# Patient Record
Sex: Female | Born: 1965 | Race: White | Hispanic: No | Marital: Married | Smoking: Never smoker
Health system: Southern US, Community
[De-identification: ages and names within clinical notes are randomized; demographics above are authoritative.]

## PROBLEM LIST (undated history)

## (undated) DIAGNOSIS — G809 Cerebral palsy, unspecified: Secondary | ICD-10-CM

## (undated) DIAGNOSIS — H269 Unspecified cataract: Secondary | ICD-10-CM

## (undated) DIAGNOSIS — S343XXA Injury of cauda equina, initial encounter: Secondary | ICD-10-CM

## (undated) DIAGNOSIS — I10 Essential (primary) hypertension: Secondary | ICD-10-CM

## (undated) DIAGNOSIS — G709 Myoneural disorder, unspecified: Secondary | ICD-10-CM

## (undated) HISTORY — DX: Cerebral palsy, unspecified: G80.9

## (undated) HISTORY — PX: COSMETIC SURGERY: SHX468

## (undated) HISTORY — DX: Myoneural disorder, unspecified: G70.9

## (undated) HISTORY — PX: EYE SURGERY: SHX253

## (undated) HISTORY — DX: Unspecified cataract: H26.9

## (undated) HISTORY — DX: Injury of cauda equina, initial encounter: S34.3XXA

## (undated) HISTORY — DX: Essential (primary) hypertension: I10

## (undated) HISTORY — PX: SPINE SURGERY: SHX786

---

## 2015-02-13 ENCOUNTER — Telehealth: Payer: Self-pay | Admitting: Family Medicine

## 2015-02-13 ENCOUNTER — Ambulatory Visit (INDEPENDENT_AMBULATORY_CARE_PROVIDER_SITE_OTHER): Payer: BLUE CROSS/BLUE SHIELD | Admitting: Family Medicine

## 2015-02-13 VITALS — BP 152/80 | HR 85 | Temp 98.4°F | Resp 18 | Wt 213.6 lb

## 2015-02-13 DIAGNOSIS — M6248 Contracture of muscle, other site: Secondary | ICD-10-CM | POA: Diagnosis not present

## 2015-02-13 DIAGNOSIS — M5412 Radiculopathy, cervical region: Secondary | ICD-10-CM

## 2015-02-13 DIAGNOSIS — Z9889 Other specified postprocedural states: Secondary | ICD-10-CM

## 2015-02-13 DIAGNOSIS — G709 Myoneural disorder, unspecified: Secondary | ICD-10-CM | POA: Insufficient documentation

## 2015-02-13 DIAGNOSIS — M62838 Other muscle spasm: Secondary | ICD-10-CM

## 2015-02-13 MED ORDER — PREDNISONE 10 MG PO TABS
ORAL_TABLET | ORAL | Status: DC
Start: 2015-02-13 — End: 2015-02-13

## 2015-02-13 MED ORDER — PREDNISONE 10 MG PO TABS: ORAL_TABLET | ORAL | Status: DC

## 2015-02-13 MED ORDER — CYCLOBENZAPRINE HCL 5 MG PO TABS: 5.0000 mg | ORAL_TABLET | Freq: Three times a day (TID) | ORAL | Status: DC | PRN

## 2015-02-13 NOTE — Progress Notes (Addendum)
Subjective:  This chart was scribed for Patricia SorensonEva Shaquala Broeker, MD by Stann Oresung-Kai Tsai, Medical Scribe. This patient was seen in Room 12 and the patient's care was started 4:51 PM.   Patient ID: Patricia MoritaLinda Braun, female    DOB: 07/13/1965, 49 y.o.   MRN: [id number] Chief Complaint  Patient presents with  . Back Pain    left arm pain    HPI Patricia Braun is a 49 y.o. female who presents to [facility] complaining of gradual onset left back pain radiating down to her left arm that started 3 days ago. She thought it was muscle spasm. She's tried applying heat to the areas and taking aleve without relief. She notes the pain is located in the middle of her back on the left side. She's noticed tingling pins and needles pain radiating down to the ulnar aspect of her left wrist, and down to the 5th digit. She's lost sleep because she would wake up every 2 hours due to pain. She denies rash or any skin changes, and neck pain. She denies kidney problems.   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 She informs that she woke up with this pain and had to go to ED at that time.   [place] was previously in [place]. She moved here in the past year for work (though works from home) and has not yet established w/ any physician.    Past Medical History  Diagnosis Date  . Cataract   . Hypertension   . Neuromuscular disorder (HCC)   . Cauda equina spinal cord injury (HCC)   . Cerebral palsy (HCC)    Prior to Admission medications   Medication Sig Start Date End Date Taking? Authorizing Provider  Multiple Vitamin (MULTIVITAMIN) capsule Take 1 capsule by mouth daily.   Yes Historical Provider, MD  naproxen sodium (ANAPROX) 220 MG tablet Take 220 mg by mouth 2 (two) times daily with a meal.   Yes Historical Provider, MD   Allergies  Allergen Reactions  . Fruit & Vegetable Daily [Nutritional Supplements]   . Vanilla Butternut Flavor     Review of Systems  Constitutional: Positive for activity change. Negative for fever,  chills, diaphoresis and fatigue.  Respiratory: Negative for chest tightness.   Gastrointestinal: Negative for nausea, vomiting, diarrhea and constipation.  Musculoskeletal: Positive for myalgias (left arm), back pain, arthralgias (left wrist), gait problem and neck stiffness (slight). Negative for joint swelling and neck pain.  Skin: Negative for rash and wound.  Allergic/Immunologic: Negative for immunocompromised state.  Neurological: Positive for numbness. Negative for weakness.       Objective:   Physical Exam  Constitutional: She is oriented to person, place, and time. She appears well-developed and well-nourished. No distress.  HENT:  Head: Normocephalic and atraumatic.  Eyes: EOM are normal. Pupils are equal, round, and reactive to light.  Neck: Neck supple.  Cardiovascular: Normal rate.   Pulmonary/Chest: Effort normal. No respiratory distress.  Musculoskeletal: Normal range of motion.  point tenderness around T-8, very severe. Likely scoliosis with severe tenderness to palpation in left lower rhomboid. Severe pain across upper thoracic and lower cervical spine process, and whole left trapezius tender to palpation Hyperesthetic over left trapezius and left wrist  Neurological: She is alert and oriented to person, place, and time.  Reflex Scores:      Tricep reflexes are 3+ on the right side and 2+ on the left side.      Bicep reflexes are 3+ on the right  side and 2+ on the left side.      Brachioradialis reflexes are 3+ on the right side and 2+ on the left side. Grasp normal despite pain  Skin: Skin is warm and dry. No rash noted.  Psychiatric: She has a normal mood and affect. Her behavior is normal.  Nursing note and vitals reviewed.   BP 152/80 mmHg  Pulse 85  Temp(Src) 98.4 F (36.9 C) (Oral)  Resp 18  Wt 213 lb 9.6 oz (96.888 kg)  SpO2 98%     Assessment & Plan:   1. Trapezius muscle spasm - take  qhs and 5 mg during day as tolerated by sedation. Heat.    2. Cervical radiculopathy - sxs of Lt C8 radiculopathy, no relief with aleve, try pred taper - f/u if no relief or recurs  3. History of cervical spinal surgery - pt reports that due to her CP she is "prone to spinal cord injuries" and had an emergent surgery on C6-7 20 yrs prior. No records avail but release signed.  Would be cautious about sxs and asked pt to follow up asap for any continue or progressive sxs as low thresh hold for imaging.    Meds ordered this encounter  Medications  . DISCONTD: naproxen sodium (ANAPROX) 220 MG tablet    Sig: Take 220 mg by mouth 2 (two) times daily with a meal.  . Multiple Vitamin (MULTIVITAMIN) capsule    Sig: Take 1 capsule by mouth daily.  Marland Kitchen DISCONTD: cyclobenzaprine (FLEXERIL) 5 MG tablet    Sig: Take 1-2 tablets (5-10 mg total) by mouth 3 (three) times daily as needed for muscle spasms.    Dispense:  30 tablet    Refill:  1  . DISCONTD: predniSONE (DELTASONE) 10 MG tablet    Sig: 6-5-4-3-2-1 tabs po qd    Dispense:  21 tablet    Refill:  0  . cyclobenzaprine (FLEXERIL) 5 MG tablet    Sig: Take 1-2 tablets (5-10 mg total) by mouth 3 (three) times daily as needed for muscle spasms.    Dispense:  30 tablet    Refill:  1  . predniSONE (DELTASONE) 10 MG tablet    Sig: 6-5-4-3-2-1 tabs po qd    Dispense:  21 tablet    Refill:  0    I personally performed the services described in this documentation, which was scribed in my presence. The recorded information has been reviewed and considered, and addended by me as needed.  Patricia Sorenson, MD MPH  By signing my name below, I, Stann Ore, attest that this documentation has been prepared under the direction and in the presence of Patricia Sorenson, MD. Electronically Signed: Stann Ore, Scribe. 02/13/2015 , 4:51 PM .

## 2015-02-13 NOTE — Patient Instructions (Signed)
I think you have a muscle spasm in your left trapezius.  I think you could benefit from some gentle physical therapy (massage, myofascial release) if this persists.  Apply heat to your upper left back 3-4 times a day for 2 minutes. If this worsens, return to clinic immediately.  If the finger numbness continues or returns, come back immediately as we would want to get a neck xray and poss a MRI to see if the spinal nerve is pinched. While you are on the prednisone, do not use with any other otc pain medication other than tylenol/acetaminophen - so no aleve, ibuprofen, motrin, advil, etc. If you are having side effects on the prednisone, stop it and switch back to naprosyn.  Cervical Radiculopathy Cervical radiculopathy happens when a nerve in the neck (cervical nerve) is pinched or bruised. This condition can develop because of an injury or as part of the normal aging process. Pressure on the cervical nerves can cause pain or numbness that runs from the neck all the way down into the arm and fingers. Usually, this condition gets better with rest. Treatment may be needed if the condition does not improve.  CAUSES This condition may be caused by:  Injury.  Slipped (herniated) disk.  Muscle tightness in the neck because of overuse.  Arthritis.  Breakdown or degeneration in the bones and joints of the spine (spondylosis) due to aging.  Bone spurs that may develop near the cervical nerves. SYMPTOMS Symptoms of this condition include:  Pain that runs from the neck to the arm and hand. The pain can be severe or irritating. It may be worse when the neck is moved.  Numbness or weakness in the affected arm and hand. DIAGNOSIS This condition may be diagnosed based on symptoms, medical history, and a physical exam. You may also have tests, including:  X-rays.  CT scan.  MRI.  Electromyogram (EMG).  Nerve conduction tests. TREATMENT In many cases, treatment is not needed for this condition.  With rest, the condition usually gets better over time. If treatment is needed, options may include:  Wearing a soft neck collar for short periods of time.  Physical therapy to strengthen your neck muscles.  Medicines, such as NSAIDs, oral corticosteroids, or spinal injections.  Surgery. This may be needed if other treatments do not help. Various types of surgery may be done depending on the cause of your problems. HOME CARE INSTRUCTIONS Managing Pain  Take over-the-counter and prescription medicines only as told by your health care provider.  If directed, apply ice to the affected area.  Put ice in a plastic bag.  Place a towel between your skin and the bag.  Leave the ice on for 20 minutes, 2-3 times per day.  If ice does not help, you can try using heat. Take a warm shower or warm bath, or use a heat pack as told by your health care provider.  Try a gentle neck and shoulder massage to help relieve symptoms. Activity  Rest as needed. Follow instructions from your health care provider about any restrictions on activities.  Do stretching and strengthening exercises as told by your health care provider or physical therapist. General Instructions  If you were given a soft collar, wear it as told by your health care provider.  Use a flat pillow when you sleep.  Keep all follow-up visits as told by your health care provider. This is important. SEEK MEDICAL CARE IF:  Your condition does not improve with treatment. SEEK IMMEDIATE  MEDICAL CARE IF:  Your pain gets much worse and cannot be controlled with medicines.  You have weakness or numbness in your hand, arm, face, or leg.  You have a high fever.  You have a stiff, rigid neck.  You lose control of your bowels or your bladder (have incontinence).  You have trouble with walking, balance, or speaking.   This information is not intended to replace advice given to you by your health care provider. Make sure you discuss  any questions you have with your health care provider.   Document Released: 11/21/2000 Document Revised: 11/17/2014 Document Reviewed: 04/22/2014 Elsevier Interactive Patient Education [employer]

## 2015-02-13 NOTE — Telephone Encounter (Signed)
Outside call.  Pt at pharmacy. meds not there. In Epic - rx sent, but pharmacy does not have record. Verified/reordered prednisone and flexeril as listed by Rx from Dr. Clelia CroftShaw today. Patient advised.

## 2015-02-15 ENCOUNTER — Telehealth: Payer: Self-pay

## 2015-02-15 ENCOUNTER — Ambulatory Visit (INDEPENDENT_AMBULATORY_CARE_PROVIDER_SITE_OTHER): Payer: BLUE CROSS/BLUE SHIELD | Admitting: Family Medicine

## 2015-02-15 ENCOUNTER — Ambulatory Visit (INDEPENDENT_AMBULATORY_CARE_PROVIDER_SITE_OTHER): Payer: BLUE CROSS/BLUE SHIELD

## 2015-02-15 VITALS — BP 126/88 | HR 76 | Temp 99.1°F | Resp 16 | Wt 213.5 lb

## 2015-02-15 DIAGNOSIS — M21372 Foot drop, left foot: Secondary | ICD-10-CM

## 2015-02-15 DIAGNOSIS — M25512 Pain in left shoulder: Secondary | ICD-10-CM

## 2015-02-15 DIAGNOSIS — G809 Cerebral palsy, unspecified: Secondary | ICD-10-CM | POA: Diagnosis not present

## 2015-02-15 DIAGNOSIS — M25532 Pain in left wrist: Secondary | ICD-10-CM | POA: Diagnosis not present

## 2015-02-15 DIAGNOSIS — Z9889 Other specified postprocedural states: Secondary | ICD-10-CM | POA: Diagnosis not present

## 2015-02-15 DIAGNOSIS — M5412 Radiculopathy, cervical region: Secondary | ICD-10-CM | POA: Diagnosis not present

## 2015-02-15 DIAGNOSIS — R269 Unspecified abnormalities of gait and mobility: Secondary | ICD-10-CM

## 2015-02-15 DIAGNOSIS — Z79899 Other long term (current) drug therapy: Secondary | ICD-10-CM

## 2015-02-15 LAB — COMPREHENSIVE METABOLIC PANEL
ALBUMIN: 4.7 g/dL (ref 3.6–5.1)
ALT: 20 U/L (ref 6–29)
AST: 22 U/L (ref 10–35)
Alkaline Phosphatase: 73 U/L (ref 33–130)
BILIRUBIN TOTAL: 0.5 mg/dL (ref 0.2–1.2)
BUN: 18 mg/dL (ref 7–25)
CO2: 27 mmol/L (ref 20–31)
CREATININE: 0.69 mg/dL (ref 0.50–1.05)
Calcium: 9.6 mg/dL (ref 8.6–10.4)
Chloride: 101 mmol/L (ref 98–110)
Glucose, Bld: 120 mg/dL — ABNORMAL HIGH (ref 65–99)
Potassium: 4.4 mmol/L (ref 3.5–5.3)
Sodium: 138 mmol/L (ref 135–146)
TOTAL PROTEIN: 7.6 g/dL (ref 6.1–8.1)

## 2015-02-15 LAB — C-REACTIVE PROTEIN: CRP: <0.5 mg/dL (ref <0.60)

## 2015-02-15 MED ORDER — KETOROLAC TROMETHAMINE 60 MG/2ML IM SOLN
60.0000 mg | Freq: Once | INTRAMUSCULAR | Status: AC
  Administered 2015-02-15: 60 mg via INTRAMUSCULAR

## 2015-02-15 MED ORDER — DIAZEPAM 5 MG PO TABS: 5.0000 mg | ORAL_TABLET | Freq: Four times a day (QID) | ORAL | Status: DC | PRN

## 2015-02-15 MED ORDER — HYDROCODONE-ACETAMINOPHEN 5-325 MG PO TABS: 1.0000 | ORAL_TABLET | Freq: Four times a day (QID) | ORAL | Status: AC | PRN

## 2015-02-15 NOTE — Telephone Encounter (Signed)
The answering machine called and stated pt called after hours to let Dr Clelia CroftShaw know she is in a lot of pain and would like a callback at [phone]

## 2015-02-15 NOTE — Progress Notes (Addendum)
Subjective:    Patient ID: Patricia Braun, female    DOB: 19-Dec-1965, 49 y.o.   MRN: [id number] By signing my name below, I, Patricia Braun, attest that this documentation has been prepared under the direction and in the presence of [name], MD.  Electronically Signed: Littie Braun, Medical Scribe. 02/15/2015. 1:04 PM.  Chief Complaint  Patient presents with  . Follow-up    from last visit, says the soreness and numbness if worst    HPI HPI Comments: Patricia Braun is a 49 y.o. female who presents to the [facility] for a follow-up. History of cerebral palsy. She was told that due to her premature NCP status, she would be prone to spinal cord injuries and had emergent surgery on C5-C7 20 years ago when she suddenly woke up with inability to move her legs. She was seen 2 days ago with severe pain in her left back radiating to her left arm with pain causing numbness and tingling in her 5th digit and ulnar aspect of wrist. She was treated with a prednisone 60 mg taper and flexeril, but pain has severely worsened.   Patient did take the flexeril before bed after she was seen here 2 days ago. She did use a brace which helped with her left wrist and hand pain, but she still had difficulty sleeping due to pain, only getting about 5 hours of sleep that night. She did have some urinary frequency that night only. She started the prednisone the following morning. The pain seemed to be improving throughout that day, but then it started to worsen again. Patient still has constant numbness and tingling to her left 5th finger and hand. She notes having intermittent pain even if she keeps her hand still. She feels as if the pain originates at the wrist and is not a shooting pain. She has pain whenever she tries to pick up anything. Patient is also still having pain from her left back/neck radiating to her left upper arm. She notes that the pain in her thumb is improving. She has also been having  associated lightheadedness and difficulty concentrating. She does not have any pain to her elbow. She last took 2 Aleve at 3:00 AM this morning. Patient denies history of kidney problems. She notes that she did have some numbness to her 5th finger before, but this was minor and she attributes this to sleeping wrong.  Patient also states she has had 7 months of worsening foot drop. She is much more dependent on her walker. She used to be able to walk leaning on walls or using furniture.  Past Medical History  Diagnosis Date  . Cataract   . Hypertension   . Neuromuscular disorder (HCC)   . Cauda equina spinal cord injury (HCC)   . Cerebral palsy [facility])    Past Surgical History  Procedure Laterality Date  . Cosmetic surgery    . Eye surgery    . Spine surgery     Current Outpatient Prescriptions on File Prior to Visit  Medication Sig Dispense Refill  . Multiple Vitamin (MULTIVITAMIN) capsule Take 1 capsule by mouth daily.     No current facility-administered medications on file prior to visit.   Allergies  Allergen Reactions  . Fruit & Vegetable Daily [Nutritional Supplements]   . Vanilla Butternut Flavor    History reviewed. No pertinent family history. Social History   Social History  . Marital Status: Married    Spouse Name: N/A  .  Number of Children: N/A  . Years of Education: N/A   Social History Main Topics  . Smoking status: Never Smoker   . Smokeless tobacco: None  . Alcohol Use: None  . Drug Use: None  . Sexual Activity: Not Asked   Other Topics Concern  . None   Social History Narrative   Depression screen [facility] 2/9 02/13/2015  Decreased Interest 0  Down, Depressed, Hopeless 0  PHQ - 2 Score 0     Review of Systems  Constitutional: Positive for activity change and fatigue. Negative for fever, chills and appetite change.  Cardiovascular: Negative for leg swelling.  Gastrointestinal: Negative for nausea, vomiting, abdominal pain and diarrhea.    Genitourinary: Positive for frequency. Negative for dysuria, flank pain and difficulty urinating.  Musculoskeletal: Positive for myalgias, back pain, arthralgias, gait problem and neck pain. Negative for joint swelling.  Skin: Negative for color change and rash.  Allergic/Immunologic: Negative for immunocompromised state.  Neurological: Positive for weakness, light-headedness and numbness. Negative for dizziness and facial asymmetry.  Psychiatric/Behavioral: Positive for sleep disturbance and decreased concentration. Negative for behavioral problems, confusion, self-injury and dysphoric mood. The patient is not nervous/anxious.        Objective:  BP 126/88 mmHg  Pulse 76  Temp(Src) 99.1 F (37.3 C) (Oral)  Resp 16  Wt 213 lb 8 oz (96.843 kg)  SpO2 96%  Physical Exam  Constitutional: She is oriented to person, place, and time. She appears well-developed and well-nourished. No distress.  HENT:  Head: Normocephalic and atraumatic.  Mouth/Throat: Oropharynx is clear and moist. No oropharyngeal exudate.  Eyes: Pupils are equal, round, and reactive to light.  Neck: Neck supple.  Cardiovascular: Normal rate.   Pulmonary/Chest: Effort normal.  Musculoskeletal: She exhibits no edema.  Normal ROM in left wrist and left elbow. Moderately decreased ROM in left shoulder and cervical spine. Patient has severe pain with motion and with light palpation, leaving us unable to do muscle testing due to pain severity.  Neurological: She is alert and oriented to person, place, and time.  Reflex Scores:      Tricep reflexes are 3+ on the right side and 3+ on the left side.      Bicep reflexes are 3+ on the right side and 3+ on the left side.      Brachioradialis reflexes are 3+ on the right side and 3+ on the left side. Strength in wrist flexion and wrist extension 5/5, though very limited ability to withstand additional muscle testing due to pain. 4/5 bilateral plantarflexion and dorsiflexion. 4+/5  hamstrings and quads on right, 4/5 hamstrings and quads on left.  Skin: Skin is warm and dry. No rash noted.  Psychiatric: She has a normal mood and affect. Her behavior is normal.  Nursing note and vitals reviewed.  [facility] (PRIMARY) x-ray report read by Dr. 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 Loss of normal lordosis and C5-C7 fusion. Hardware appears grossly in place. Left wrist - Moderate arthritic change. No acute abnormality.    Dg Cervical Spine Complete  02/15/2015  CLINICAL DATA:  Cervical radiculopathy. Radiculopathy is severely worsening. EXAM: CERVICAL SPINE - COMPLETE 4+ VIEW COMPARISON:  None. FINDINGS: They corpectomy is noted at C6. Fusion is present from C5-C7. Alignment is anatomic. Marked adjacent level endplate degenerative changes present at C4-5. Significant uncovertebral spurring is present bilaterally. This results and moderate to severe osseous foraminal narrowing bilaterally at C4-5. Osseous foraminal narrowing is also present bilaterally at C7-T1. There  slight retrolisthesis at C4-5. Vertebral body height are normal. Alignment is otherwise maintained. IMPRESSION: 1. Adjacent level disease with chronic endplate changes and moderate bilateral foraminal stenosis due to uncovertebral spurring at C4-5 and to a slightly lesser extent at C7-T1. Electronically Signed   By: Marin Roberts M.D.   On: 02/15/2015 14:42   Dg Wrist 2 Views Left  02/15/2015  CLINICAL DATA:  49 year old female with acute left wrist pain, no known injury. Initial encounter. EXAM: LEFT WRIST - 2 VIEW COMPARISON:  None. FINDINGS: Bone mineralization is within normal limits. Distal radius and ulna intact. Radiocarpal joint space and carpal joint spaces within normal limits. Carpal alignment within normal limits. Metacarpals and MCP joint spaces within normal limits. Phalanges intact. The tip of the third distal phalanx is not entirely included. IMPRESSION: No osseous abnormality  identified in the left hand. Electronically Signed   By: Odessa Fleming M.D.   On: 02/15/2015 14:41    Assessment & Plan:   1. 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 Pt reports that prior c-spine surgery was done emergently when over several weeks she developed worsening lower ext weakness and pain and she is now left with permanent parasthesias below the waist from cord injury from this.  Pt now with 5-7d of severely worsening left cervical radiculopathy  sxs causing severe hyperesthesias through left arm, failed conservative treatment with prednisone and flexeril.  Stop flexeril.  Cont prednisone  x 5d then wean down (additional pills sent to pharmacy). Called [facility] today but they won't even schedule pt to see a provider despite her severity and my request until we get her prior 49 yo surgical records from [place] which is very frustrating as there is nothing else I can offer pt with her worsening sxs since she is maxed out on medical therapy of high dose prednisone along with valium for muscle relaxer/sleep and vicodin to dull pain. May need to add on gabapentin but I am worried about new polypharmacy and sedation as pt is naive to most of these meds.  CMA spoke with pt's prior physician who will fax her old records stat but unlikely to be able to track down surgical records due to age.  Due to multiple areas of concern on spin of C4-5, c7-T1, and lumbar spine have already proceeded with stat neurosurg referral and c-spine MRI.  2. History of cervical spinal surgery   3. Wrist pain, acute, left   4. Polypharmacy   5. Cerebral palsy, unspecified type (HCC)   6. Abnormality of gait   7. Left foot drop - pt with worsening 7 mos of left lower ext weakness causing more gait instability and debility - will need to start on imaging of lumbar spine as well - sxs more difficult to follow  since pt has chronic lower ext paraesthesia and gait instability from prior cord injury and CP.  Needs l-spine xray and likely MRI referral for this as well though I am really hoping pt can get in to ortho or neurosurg for this since clearly her problems are far beyond my scope.  8. Shoulder pain, acute, left     Orders Placed This Encounter  Procedures  . DG Cervical Spine Complete    Standing Status: Future     Number of Occurrences: 1     Standing Expiration Date: 02/15/2016    Order  Specific Question:  Reason for Exam (SYMPTOM  OR DIAGNOSIS REQUIRED)    Answer:  severely worsening cervical radiculopathy    Order Specific Question:  Is the patient pregnant?    Answer:  No    Order Specific Question:  Preferred imaging location?    Answer:  External  . DG Wrist 2 Views Left    Standing Status: Future     Number of Occurrences: 1     Standing Expiration Date: 02/15/2016    Order Specific Question:  Reason for Exam (SYMPTOM  OR DIAGNOSIS REQUIRED)    Answer:  severe pain    Order Specific Question:  Is the patient pregnant?    Answer:  No    Order Specific Question:  Preferred imaging location?    Answer:  External  . MR Cervical Spine W Contrast    Standing Status: Future     Number of Occurrences:      Standing Expiration Date: 04/17/2016    Order Specific Question:  If indicated for the ordered procedure, I authorize the administration of contrast media per Radiology protocol    Answer:  Yes    Order Specific Question:  Reason for Exam (SYMPTOM  OR DIAGNOSIS REQUIRED)    Answer:  worsening Left cervical radiculopathy concern for C4-5 impingement; h/o emergenct c-spine surg - prone to injury due to CP per pt    Order Specific Question:  Preferred imaging location?    Answer:  [facility]    Order Specific Question:  Does the patient have a pacemaker or implanted devices?    Answer:  No    Order Specific Question:  What is the patient's sedation requirement?    Answer:   Anti-anxiety  . Sedimentation Rate  . C-reactive protein  . Comprehensive metabolic panel  . Ambulatory referral to Orthopedic Surgery    Referral Priority:  Urgent    Referral Type:  Surgical    Referral Reason:  Specialty Services Required    Requested Specialty:  Orthopedic Surgery    Number of Visits Requested:  1  . Ambulatory referral to Neurosurgery    Referral Priority:  Urgent    Referral Type:  Surgical    Referral Reason:  Specialty Services Required    Requested Specialty:  Neurosurgery    Number of Visits Requested:  1    Meds ordered this encounter  Medications  . ketorolac (TORADOL) injection 60 mg    Sig:   . diazepam (VALIUM) 5 MG tablet    Sig: Take 1 tablet (5 mg total) by mouth every 6 (six) hours as needed for muscle spasms or sedation.    Dispense:  30 tablet    Refill:  0  . HYDROcodone-acetaminophen (NORCO/VICODIN) 5-325 MG tablet    Sig: Take 1 tablet by mouth every 6 (six) hours as needed for moderate pain.    Dispense:  30 tablet    Refill:  0    I personally performed the services described in this documentation, which was scribed in my presence. The recorded information has been reviewed and considered, and addended by me as needed.  [name], MD MPH

## 2015-02-15 NOTE — Patient Instructions (Signed)
If the pain is getting worse or more weakness or numbness, then please go to ER or come back immediately. In the meantime, I have placed stat urgent referral for the MRI, orthopedics, and neurosurgery.  Continue on the prednisone 60 for Wed and Thursday.  Make sure we touch base on Thursday to ensure you have needed referrals set up and decide how to continue/taper you off of the prednisone. Cervical Radiculopathy Cervical radiculopathy happens when a nerve in the neck (cervical nerve) is pinched or bruised. This condition can develop because of an injury or as part of the normal aging process. Pressure on the cervical nerves can cause pain or numbness that runs from the neck all the way down into the arm and fingers. Usually, this condition gets better with rest. Treatment may be needed if the condition does not improve.  CAUSES This condition may be caused by:  Injury.  Slipped (herniated) disk.  Muscle tightness in the neck because of overuse.  Arthritis.  Breakdown or degeneration in the bones and joints of the spine (spondylosis) due to aging.  Bone spurs that may develop near the cervical nerves. SYMPTOMS Symptoms of this condition include:  Pain that runs from the neck to the arm and hand. The pain can be severe or irritating. It may be worse when the neck is moved.  Numbness or weakness in the affected arm and hand. DIAGNOSIS This condition may be diagnosed based on symptoms, medical history, and a physical exam. You may also have tests, including:  X-rays.  CT scan.  MRI.  Electromyogram (EMG).  Nerve conduction tests. TREATMENT In many cases, treatment is not needed for this condition. With rest, the condition usually gets better over time. If treatment is needed, options may include:  Wearing a soft neck collar for short periods of time.  Physical therapy to strengthen your neck muscles.  Medicines, such as NSAIDs, oral corticosteroids, or spinal  injections.  Surgery. This may be needed if other treatments do not help. Various types of surgery may be done depending on the cause of your problems. HOME CARE INSTRUCTIONS Managing Pain  Take over-the-counter and prescription medicines only as told by your health care provider.  If directed, apply ice to the affected area.  Put ice in a plastic bag.  Place a towel between your skin and the bag.  Leave the ice on for 20 minutes, 2-3 times per day.  If ice does not help, you can try using heat. Take a warm shower or warm bath, or use a heat pack as told by your health care provider.  Try a gentle neck and shoulder massage to help relieve symptoms. Activity  Rest as needed. Follow instructions from your health care provider about any restrictions on activities.  Do stretching and strengthening exercises as told by your health care provider or physical therapist. General Instructions  If you were given a soft collar, wear it as told by your health care provider.  Use a flat pillow when you sleep.  Keep all follow-up visits as told by your health care provider. This is important. SEEK MEDICAL CARE IF:  Your condition does not improve with treatment. SEEK IMMEDIATE MEDICAL CARE IF:  Your pain gets much worse and cannot be controlled with medicines.  You have weakness or numbness in your hand, arm, face, or leg.  You have a high fever.  You have a stiff, rigid neck.  You lose control of your bowels or your bladder (have incontinence).  You have trouble with walking, balance, or speaking.   This information is not intended to replace advice given to you by your health care provider. Make sure you discuss any questions you have with your health care provider.   Document Released: 11/21/2000 Document Revised: 11/17/2014 Document Reviewed: 04/22/2014 Elsevier Interactive Patient Education [employer].

## 2015-02-15 NOTE — Telephone Encounter (Signed)
Pt is needing to talk with dr Clelia Croftshaw, she is still having pain and numbness and has not been able to sleep much

## 2015-02-15 NOTE — Telephone Encounter (Signed)
She is here in the clinic seeing Dr. Clelia CroftShaw.

## 2015-02-15 NOTE — Telephone Encounter (Signed)
Pt is here in the office.  

## 2015-02-16 ENCOUNTER — Telehealth: Payer: Self-pay

## 2015-02-16 ENCOUNTER — Other Ambulatory Visit: Payer: Self-pay | Admitting: Family Medicine

## 2015-02-16 LAB — SEDIMENTATION RATE: SED RATE: 1 mm/h (ref 0–30)

## 2015-02-16 NOTE — Telephone Encounter (Signed)
The MRI of the cervical spine w contrast has been denied by [employer].  A peer to peer review is available for this case.  The phone number is [phone].  They will need the patient's name, DOB, and member ID number.  This appeal will close on 02/18/15 at 5pm.  The patient's member ID is [id number].

## 2015-02-16 NOTE — Telephone Encounter (Signed)
Pt would like to know if Dr Clelia CroftShaw would call her in another round of PREDNISONE 1 MG, she will finish her last round tomorrow. Please call [phone]     [facility] [address]

## 2015-02-16 NOTE — Telephone Encounter (Signed)
Spoke with Dr. Anselm JunglingHo. Peer-to-peer complete. Order ID# [id number]

## 2015-02-17 MED ORDER — PREDNISONE 20 MG PO TABS: ORAL_TABLET | ORAL | Status: AC

## 2015-02-17 NOTE — Telephone Encounter (Signed)
[facility]ainer called about this pt. They feel she should dee a neurologist instead. The pt agreed. Dr. Clelia CroftShaw please advise.

## 2015-02-17 NOTE — Telephone Encounter (Signed)
I assume they mean neurosurgery- that is fine but the fact is that this pt needs to be cared for by a specialist - she is having rapidly progressive worsening sxs on oral meds and neurosurg is impossible to get into - i already placed neurosurg referral at the time of her visit on Tues- please call [facility] and let them know that ortho is refusing pt and that she has a h/o atypical and rapidly progressive sxs - hopefully she can get an appt within next wk. . .  Please also let pt know that when she has the MRI she should ask for a copy of the disc at the time to bring with her to her next specialty appt.

## 2015-02-17 NOTE — Telephone Encounter (Signed)
Prednisone sent in to pharmacy.  Patricia Braun got authorization on c-spine MRI through peer-to-peer yesterday so please try to get scheduled EMERGENTLY. Remind pt if sxs getting worse, come back here or to the ER.

## 2015-02-18 NOTE — Telephone Encounter (Signed)
I received confirmation of the appointment for 02/21/15 at 1:30pm with Dr Gerlene FeeKritzer.  Regarding the MRI, I will contact [facility].  They are very prompt and able to work patients in.

## 2015-02-18 NOTE — Telephone Encounter (Signed)
AMAZING MELANIE!!  STRONG WORK!!!!!!  VERY IMPRESSIVE MELANIE - THANK YOU!

## 2015-02-18 NOTE — Telephone Encounter (Signed)
Patricia Braun - THANK YOU!

## 2015-02-18 NOTE — Telephone Encounter (Signed)
What is the update on the c-spine MRI? We got authorization - it is schedule for 12/21??????  This is a stat emergent MRI - needed to be done this week - prior to her neurosurg appt on Monday please.

## 2015-02-18 NOTE — Telephone Encounter (Signed)
I spoke to a scheduler at [facility], and the earliest appointment they had was Thursday 02/24/15.  [facility] has in-house MRIs, so they could do the scans on the day of her appointment if they need it for the consultation.

## 2015-02-18 NOTE — Telephone Encounter (Signed)
Spoke to the new patient coordinator at [facility], and she said they would try to get the patient in for an appointment on Monday, 02/21/15.  They will fast track the review and work the patient in.

## 2015-02-20 ENCOUNTER — Encounter: Payer: Self-pay | Admitting: Family Medicine

## 2015-02-20 NOTE — Telephone Encounter (Signed)
Perfect, thank you

## 2015-02-21 ENCOUNTER — Telehealth: Payer: Self-pay

## 2015-02-21 ENCOUNTER — Other Ambulatory Visit: Payer: Self-pay | Admitting: Family Medicine

## 2015-02-21 NOTE — Telephone Encounter (Signed)
Pt is needing to know if she can increase the amount of valium she takes at night to help her sleep

## 2015-02-21 NOTE — Telephone Encounter (Signed)
Dr. Clelia CroftShaw, pt sent a mychart message in more detail.

## 2015-02-22 ENCOUNTER — Telehealth: Payer: Self-pay

## 2015-02-22 NOTE — Telephone Encounter (Signed)
.  .  .        xx

## 2015-02-22 NOTE — Telephone Encounter (Signed)
Sent an email - valium was written from 5 mg every 6 hours as needed.  She can increase it to 10mg  twice a day but make sure to separate from the hydrocodone by 2 hours

## 2015-02-22 NOTE — Telephone Encounter (Signed)
Dr. Clelia CroftShaw  Upset patient she needs MRI not CAT SCAN,  Requesting a refund, needs to have insurance approve MRI.  She would like you to call her, ASAP.    [phone]

## 2015-02-23 NOTE — Telephone Encounter (Signed)
As anyone can see in her chart, we ordered an MRI - I never ordered or disucssed a CT scan - we couldn't get the pt's MRI until next week but neurosurgery worked pt in for an appointment in their office this past Monday (4d ago) and so they said that they would get whatever imaging she needed in their office.  Pt had xrays in our office and was given copies of the discs and she did need these before we could get insurance authorization for the MRI - which we did get.  Also, with her prior surgery it is possible that the neurosurgeon opted for a different imaging option. Please find out what is going on and how we can help.

## 2015-02-24 ENCOUNTER — Other Ambulatory Visit: Payer: Self-pay | Admitting: Family Medicine

## 2015-02-24 MED ORDER — DIAZEPAM 5 MG PO TABS: 5.0000 mg | ORAL_TABLET | Freq: Two times a day (BID) | ORAL | Status: AC | PRN

## 2015-02-24 NOTE — Telephone Encounter (Signed)
Pt already scheduled thru neurosurgery

## 2015-02-27 NOTE — Telephone Encounter (Signed)
Patricia Braun - this patient was very dissatisfied that we were not able to obtain her MRI prior to her neurosurgery appointment. We did try but [facility] could not get her in sooner and due to her radiculopathy sxs and medical history, neurosurgery did work her in quickly and informed us that they would be able to get any necessary imaging in their office after her appointment in a much more timely manner. I am concerned that it seems that perhaps she has not pursued the appropriate imaging through neurosurgery and therefore is risking permanent nerve injury with her delaying her care but not contacting their office to alert them of sxs and proceed with the recommended imaging.

## 2015-03-02 ENCOUNTER — Other Ambulatory Visit: Payer: Self-pay

## 2015-03-02 ENCOUNTER — Encounter: Payer: Self-pay | Admitting: Family Medicine

## 2015-03-04 ENCOUNTER — Encounter: Payer: Self-pay | Admitting: Family Medicine

## 2015-03-05 ENCOUNTER — Encounter: Payer: Self-pay | Admitting: Family Medicine

## 2015-03-05 ENCOUNTER — Other Ambulatory Visit: Payer: Self-pay | Admitting: Family Medicine

## 2015-03-09 ENCOUNTER — Telehealth: Payer: Self-pay

## 2015-03-09 NOTE — Telephone Encounter (Signed)
Patient is requesting a change of pharmacy to express script  -   diazepam (VALIUM) 5 MG tablet  Patient has enough pills to get her through, but wants this in place b/c of the time of delivery through the mail.    Dr Clelia CroftShaw   [phone]

## 2015-03-09 NOTE — Telephone Encounter (Signed)
Spoke with pt, I advised message from Dr. Clelia CroftShaw. She had a difficult time understanding our policy. I spent 20 minutes on the phone with pt explaining every way that I could. The protection of Dr. Clelia CroftShaw license with controlled substances, our contract she has to sign, the law in general. Dr. Clelia CroftShaw are going to be here Friday from 8-6pm? I just want to make sure because she agreed to come in on Friday. She also wants to see if you can write a letter to her employer about her neck and needing something (I cant remember exactly what she said) for her job to pay for it. I advised her that she could speak to you about it at the OV and it would be something we can possibly do. She also wanted me to send this so it can make her OV go a lot quicker and she is requesting 20 days in advance because she uses mail order. Thanks Dr. Clelia CroftShaw.

## 2015-03-09 NOTE — Telephone Encounter (Signed)
For a controlled medication, our [facility] policy requests a pt to establish with a single PCP at [facility] with whom a controlled sub contract is agreed and requests that pts come in for an office visit for refills.  I saw Ms. Patricia Braun for an acute issue about her back and rx'ed valium temporarily for acute muscle spasm so did not review the details about long term use of this med, nor get an appropriate history of her mental health and sleep issues.   While it is an acceptable therapeutic option for some people, it should only be started regularly by a provider that has a detailed understanding of pt's history and pt needs to understands potential risks/adverse outcomes so she can make an informed decision whether this is something she wants to continue. When that type of sedative is used on a daily basis it can have severe withdrawal symptoms including life-threatening seizures so causes a physical (not just mental) dependence and tolerance.    In summary, if pt is still interested on staying on valium indefinitely, she needs to come in for an appointment to establish care and discuss this.

## 2015-03-10 NOTE — Telephone Encounter (Signed)
fyi

## 2015-03-11 NOTE — Telephone Encounter (Signed)
Discussed with Bertram MillardPatty Pait of [facility] risk management (phone [phone]). It is recommended that patient establish with a primary care doctor elsewhere rather than [facility].  She can call [employer] [facility] at [phone] which is a complimentary referral service to individuals to help identify physicians in [facility] that can best serve their needs - they can help her find a good doctor for her.  She can also contact the local medical society.  She is welcome to continue to come to [facility] for her urgent care needs until she can transition into a clinic who can better provide the type of care she desires. When I was trying my hardest to fulfill her needs it was still a frustrating experience for her and I cannot do any better of a job in the future thereby leading to the likelihood that she will continue to be frustrated by whatever care I am able to provide.  Please let pt know. Thanks.  I am happy to speak directly to pt to tell her this as well if she has any questions.

## 2015-03-16 NOTE — Telephone Encounter (Signed)
I called pt at 1:50 pm the day the last note was written and informed her of that.

## 2015-11-19 ENCOUNTER — Encounter (HOSPITAL_COMMUNITY): Payer: Self-pay | Admitting: Oncology

## 2015-11-19 ENCOUNTER — Emergency Department (HOSPITAL_COMMUNITY)
Admission: EM | Admit: 2015-11-19 | Discharge: 2015-11-19 | Disposition: A | Discharge status: Home / Self Care | Payer: BLUE CROSS/BLUE SHIELD | Attending: Emergency Medicine | Admitting: Emergency Medicine

## 2015-11-19 DIAGNOSIS — R5381 Other malaise: Secondary | ICD-10-CM | POA: Insufficient documentation

## 2015-11-19 DIAGNOSIS — I10 Essential (primary) hypertension: Secondary | ICD-10-CM | POA: Diagnosis not present

## 2015-11-19 DIAGNOSIS — Z7952 Long term (current) use of systemic steroids: Secondary | ICD-10-CM | POA: Insufficient documentation

## 2015-11-19 DIAGNOSIS — Z791 Long term (current) use of non-steroidal anti-inflammatories (NSAID): Secondary | ICD-10-CM | POA: Insufficient documentation

## 2015-11-19 DIAGNOSIS — G809 Cerebral palsy, unspecified: Secondary | ICD-10-CM | POA: Diagnosis not present

## 2015-11-19 DIAGNOSIS — M62838 Other muscle spasm: Secondary | ICD-10-CM | POA: Diagnosis present

## 2015-11-19 LAB — CBC
HEMATOCRIT: 45.6 % (ref 36.0–46.0)
HEMOGLOBIN: 15 g/dL (ref 12.0–15.0)
MCH: 28.9 pg (ref 26.0–34.0)
MCHC: 32.9 g/dL (ref 30.0–36.0)
MCV: 87.9 fL (ref 78.0–100.0)
Platelets: 352 10*3/uL (ref 150–400)
RBC: 5.19 MIL/uL — AB (ref 3.87–5.11)
RDW: 13.4 % (ref 11.5–15.5)
WBC: 11.3 10*3/uL — ABNORMAL HIGH (ref 4.0–10.5)

## 2015-11-19 LAB — CBG MONITORING, ED: Glucose-Capillary: 104 mg/dL — ABNORMAL HIGH (ref 65–99)

## 2015-11-19 LAB — BASIC METABOLIC PANEL
ANION GAP: 6 (ref 5–15)
BUN: 20 mg/dL (ref 6–20)
CO2: 27 mmol/L (ref 22–32)
CREATININE: 1.13 mg/dL — AB (ref 0.44–1.00)
Calcium: 9.7 mg/dL (ref 8.9–10.3)
Chloride: 106 mmol/L (ref 101–111)
GFR calc non Af Amer: 55 mL/min — ABNORMAL LOW (ref >60)
GLUCOSE: 116 mg/dL — AB (ref 65–99)
Potassium: 4.3 mmol/L (ref 3.5–5.1)
Sodium: 139 mmol/L (ref 135–145)

## 2015-11-19 MED ORDER — IBUPROFEN 200 MG PO TABS
200.0000 mg | ORAL_TABLET | Freq: Once | ORAL | Status: AC
  Administered 2015-11-19: 200 mg via ORAL
  Filled 2015-11-19: qty 1

## 2015-11-19 NOTE — ED Triage Notes (Signed)
Per pt she was in [place] shopping today.  Approximately 1.5 hours ago pt stated she began to feel weak and friends told her she was pale.  States as she was leaving the restaurant she felt light headed. Pt denies any pain.  No Shob. Neurologically intact.  Speech is normal.  Grips equal b/l.  Pt is hypertensive in triage.  Pt is not pale at this time, face is flushed.

## 2015-11-19 NOTE — ED Provider Notes (Signed)
[facility]-EMERGENCY DEPT Provider Note   CSN: [id number] Arrival date & time: 11/19/15  1931     History   Chief Complaint Chief Complaint  Patient presents with  . Weakness    HPI Patricia Braun is a 50 y.o. female.  She is here for evaluation of an alternating period of feeling cold, then feeling hot. She also was noted to initially looked pale and looked flushed. according to her husband. In addition, she has had spasms of her arms, left greater than right, which commonly occur when she feels tired, and is secondary to her prior spinal cord injury. She denies fever, nausea, vomiting, cough, chest pain, focal weakness or paresthesia. She has a chronic gait disability secondary to cerebral palsy, and uses a walker. She and her husband were visiting in [place], [place], when the episode occurred, and they drove back here, afterwards, a 5 Hour drive. She states that she was bitten by an insect on her right volar wrist, yesterday and it caused a red bump. No other recent illnesses. There are no other known modifying factors.  HPI  Past Medical History:  Diagnosis Date  . Cataract   . Cauda equina spinal cord injury (HCC)   . Cerebral palsy (HCC)   . Hypertension   . Neuromuscular disorder [facility])     Patient Active Problem List   Diagnosis Date Noted  . Neuromuscular disorder [facility])     Past Surgical History:  Procedure Laterality Date  . COSMETIC SURGERY    . EYE SURGERY    . SPINE SURGERY      OB History    No data available       Home Medications    Prior to Admission medications   Medication Sig Start Date End Date Taking? Authorizing Provider  diazepam (VALIUM) 5 MG tablet Take 1-2 tablets (5-10 mg total) by mouth every 12 (twelve) hours as needed for muscle spasms or sedation. 02/24/15   Sherren Mocha, MD  HYDROcodone-acetaminophen (NORCO/VICODIN) 5-325 MG tablet Take 1 tablet by mouth every 6 (six) hours as needed for moderate pain. 02/15/15   Sherren Mocha, MD    Multiple Vitamin (MULTIVITAMIN) capsule Take 1 capsule by mouth daily.    Historical Provider, MD  predniSONE (DELTASONE) 20 MG tablet Take 3 tabs po qd x 3d, then 2 tabs po qd x 3d, then 1 tab po qd x 3d, then 1/2 tab po qd x 2d 02/17/15   Sherren Mocha, MD    Family History History reviewed. No pertinent family history.  Social History Social History  Substance Use Topics  . Smoking status: Never Smoker  . Smokeless tobacco: Never Used  . Alcohol use 0.0 oz/week     Comment: socially     Allergies   Fruit & vegetable daily [nutritional supplements] and Vanilla butternut flavor   Review of Systems Review of Systems  All other systems reviewed and are negative.    Physical Exam Updated Vital Signs BP 150/92 (BP Location: Left Arm)   Pulse 95   Temp 98.3 F (36.8 C) (Oral)   Resp 18   Ht 5\' 5"  (1.651 m)   Wt 190 lb (86.2 kg)   SpO2 98%   BMI 31.62 kg/m   Physical Exam  Constitutional: She is oriented to person, place, and time. She appears well-developed and well-nourished.  HENT:  Head: Normocephalic and atraumatic.  Eyes: Conjunctivae and EOM are normal. Pupils are equal, round, and reactive to light.  Neck: Normal  range of motion and phonation normal. Neck supple.  Cardiovascular: Normal rate and regular rhythm.   Pulmonary/Chest: Effort normal and breath sounds normal. She exhibits no tenderness.  Abdominal: Soft. She exhibits no distension. There is no tenderness. There is no guarding.  Musculoskeletal: Normal range of motion.  Leg atrophy, consistent with diagnosis of cerebral palsy. She walks with a halting gait. Patient and husband state that she is walking normally, at this time.  Neurological: She is alert and oriented to person, place, and time. She exhibits normal muscle tone.  Skin: Skin is warm and dry.  Small red papule right breast at site of "infected bite." No other rash.  Psychiatric: She has a normal mood and affect. Her behavior is normal.  Judgment and thought content normal.  Nursing note and vitals reviewed.    ED Treatments / Results  Labs (all labs ordered are listed, but only abnormal results are displayed) Labs Reviewed  BASIC METABOLIC PANEL - Abnormal; Notable for the following:       Result Value   Glucose, Bld 116 (*)    Creatinine, Ser 1.13 (*)    GFR calc non Af Amer 55 (*)    All other components within normal limits  CBC - Abnormal; Notable for the following:    WBC 11.3 (*)    RBC 5.19 (*)    All other components within normal limits  CBG MONITORING, ED - Abnormal; Notable for the following:    Glucose-Capillary 104 (*)    All other components within normal limits  URINALYSIS, ROUTINE W REFLEX MICROSCOPIC (NOT AT [facility])    EKG  EKG Interpretation  Date/Time:  Saturday November 19 2015 19:42:13 EDT Ventricular Rate:  85 PR Interval:    QRS Duration: 85 QT Interval:  349 QTC Calculation: 415 R Axis:   44 Text Interpretation:  Sinus rhythm No old tracing to compare Confirmed by [facility]  MD, Oluwaseun Cremer [phone]) on 11/19/2015 7:45:36 PM       Radiology No results found.  Procedures Procedures (including critical care time)  Medications Ordered in ED Medications  ibuprofen (ADVIL,MOTRIN) tablet 200 mg (200 mg Oral Given 11/19/15 2145)     Initial Impression / Assessment and Plan / ED Course  I have reviewed the triage vital signs and the nursing notes.  Pertinent labs & imaging results that were available during my care of the patient were reviewed by me and considered in my medical decision making (see chart for details).  Clinical Course    Medications  ibuprofen (ADVIL,MOTRIN) tablet 200 mg (200 mg Oral Given 11/19/15 2145)    Patient Vitals for the past 24 hrs:  BP Temp Temp src Pulse Resp SpO2 Height Weight  11/19/15 2139 150/92 98.3 F (36.8 C) Oral 95 18 98 % - -  11/19/15 1941 160/90 98.3 F (36.8 C) Oral 88 18 95 % 5\' 5"  (1.651 m) 190 lb (86.2 kg)    10:00 PM Reevaluation  with update and discussion. After initial assessment and treatment, an updated evaluation reveals No further complaints. She remains comfortable. Findings discussed with patient and husband, all questions answered. Greely Atiyeh L    Final Clinical Impressions(s) / ED Diagnoses   Final diagnoses:  Malaise    Nonspecific symptoms, in spinal cord injury patient. Evaluation reassuring. Doubt ACS, PE, serious bacterial infection or metabolic instability.  Nursing Notes Reviewed/ Care Coordinated Applicable Imaging Reviewed Interpretation of Laboratory Data incorporated into ED treatment  The patient appears reasonably screened and/or stabilized for discharge  and I doubt any other medical condition or other [facility] requiring further screening, evaluation, or treatment in the ED at this time prior to discharge.  Plan: Home Medications- continue; Home Treatments- rest, fluids; return here if the recommended treatment, does not improve the symptoms; Recommended follow up- PCP prn   New Prescriptions New Prescriptions   No medications on file     Mancel Bale, MD 11/19/15 2202

## 2015-11-19 NOTE — Discharge Instructions (Signed)
Your test results, today, were normal.  Try to get plenty of rest and drink a lot of fluids.  Your doctor if needed, for problems.

## 2015-11-19 NOTE — ED Notes (Signed)
No reaction to medication noted alert and oriented x 3 left with visitor wheelchair to car.

## 2015-11-19 NOTE — ED Notes (Signed)
Assisted pt. To the bathroom for urine. But pt. Missed the hat. Will try again later.

## 2017-04-15 IMAGING — CR DG CERVICAL SPINE COMPLETE 4+V
7 series · 7 of 7 positions shown · non-contrast
Comparison: None.

CLINICAL DATA: Cervical radiculopathy. Radiculopathy is severely
worsening.

EXAM:
CERVICAL SPINE - COMPLETE 4+ VIEW

[lateral (1 of 2)]
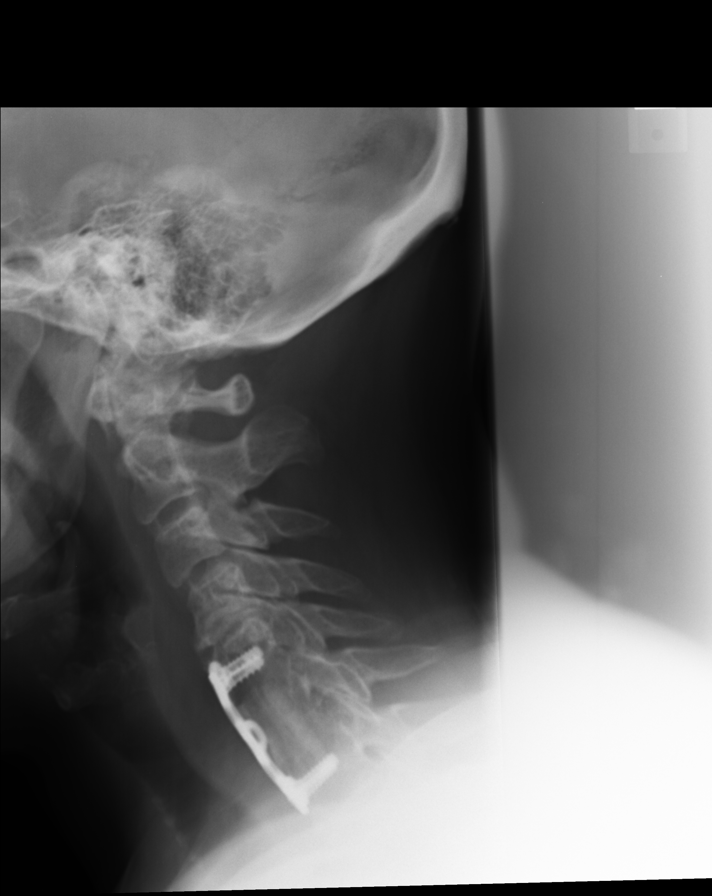

[ap open mouth]
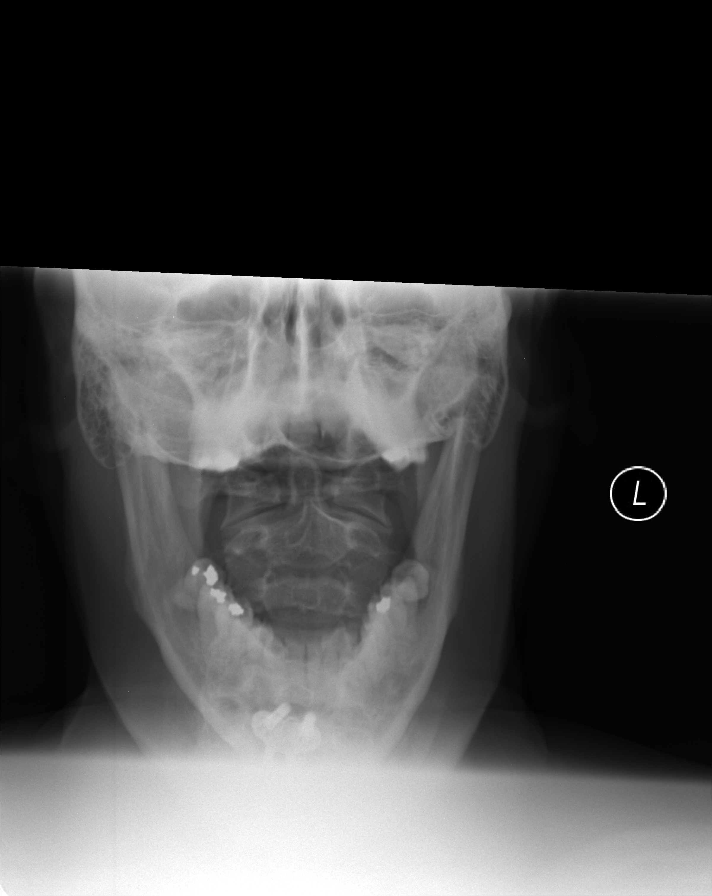

[AP]
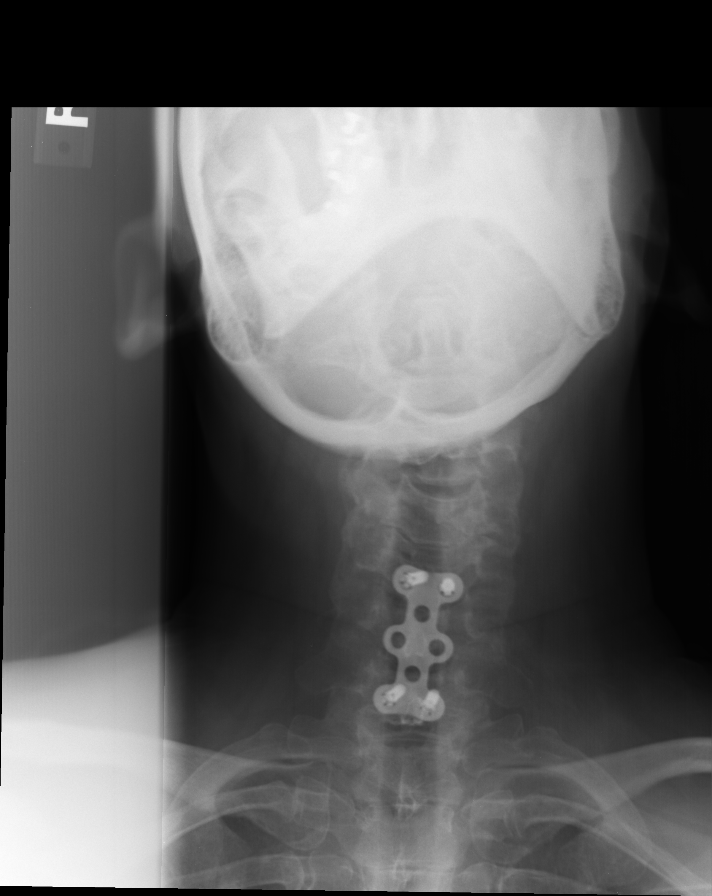

[swimmers]
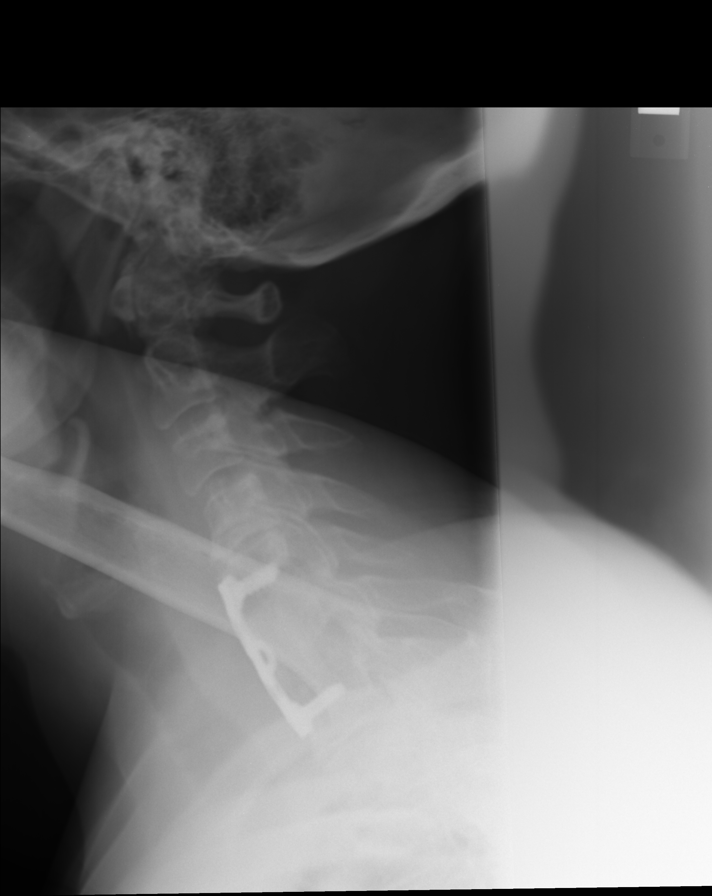

[lpo]
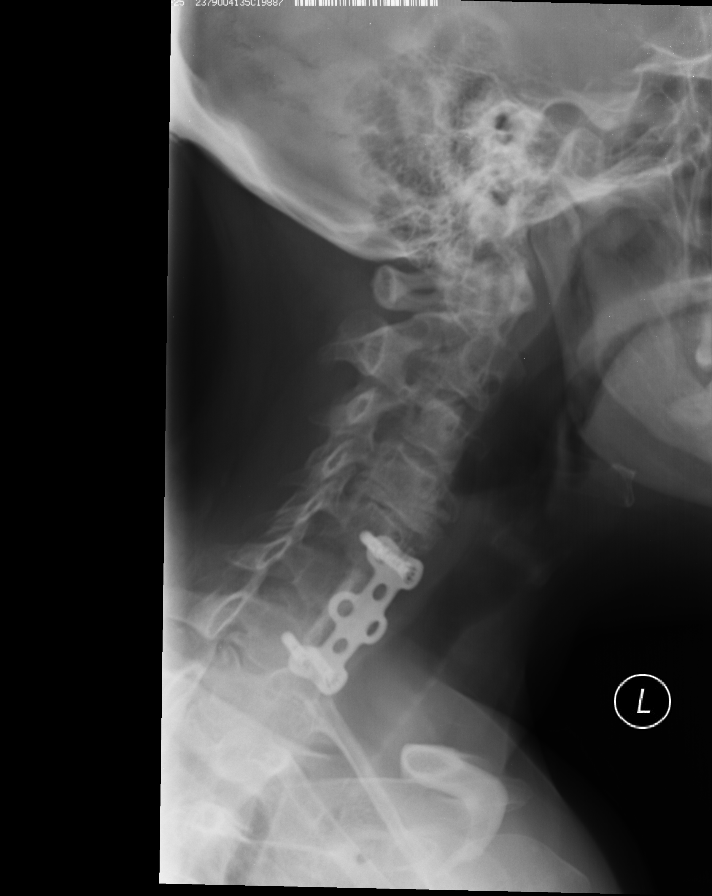

[rpo]
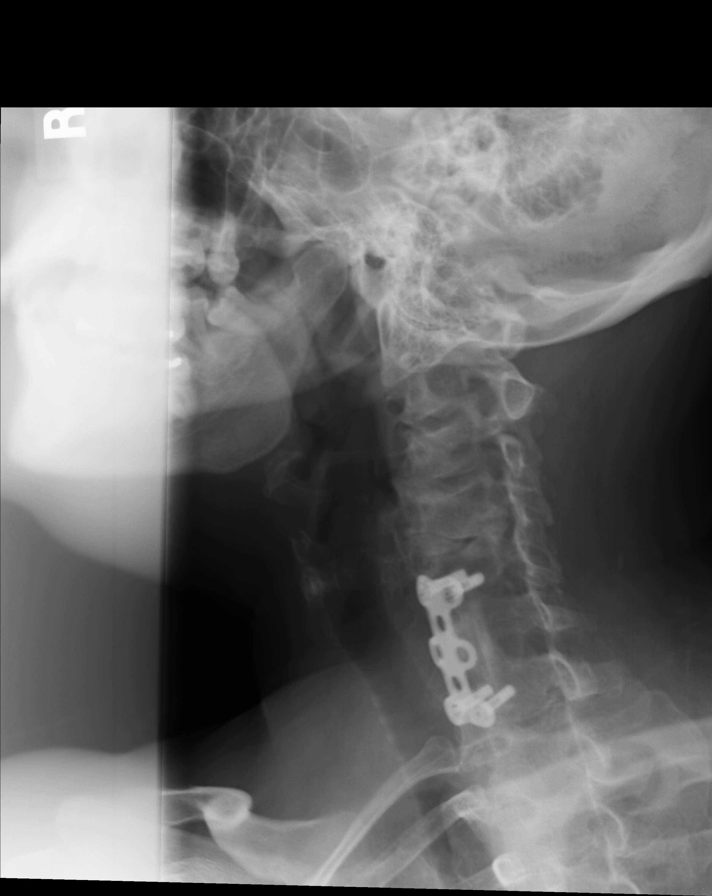

[lateral (2 of 2)]
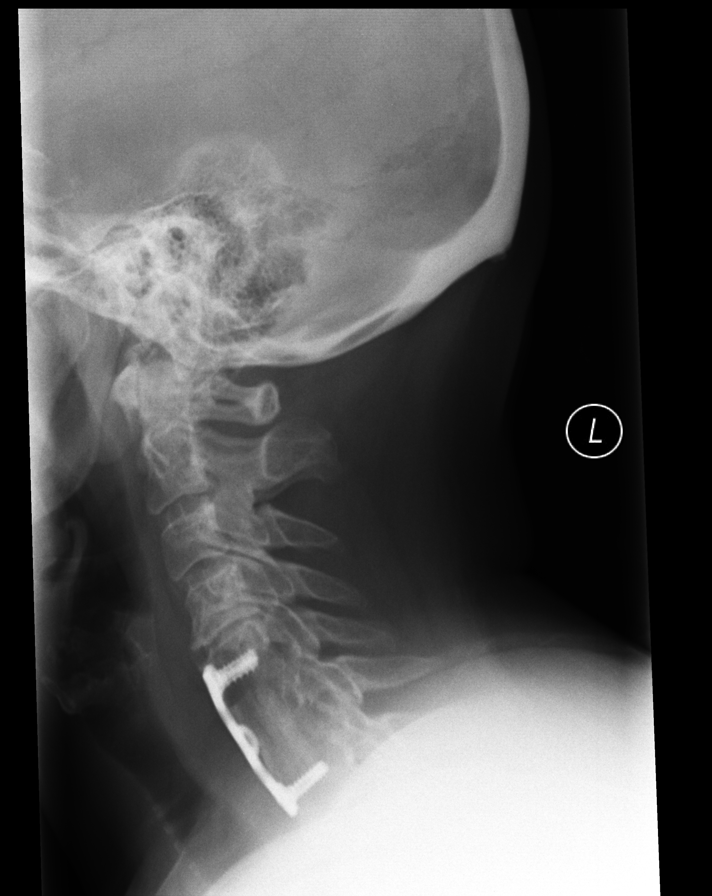

[7 of 7 positions shown; findings below may reference images not displayed]

FINDINGS: They corpectomy is noted at C6. Fusion is present from C5-C7.
Alignment is anatomic.

Marked adjacent level endplate degenerative changes present at C4-5.
Significant uncovertebral spurring is present bilaterally. This
results and moderate to severe osseous foraminal narrowing
bilaterally at C4-5. Osseous foraminal narrowing is also present
bilaterally at C7-T1. There slight retrolisthesis at C4-5. Vertebral
body height are normal. Alignment is otherwise maintained.
IMPRESSION: 1. Adjacent level disease with chronic endplate changes and moderate
bilateral foraminal stenosis due to uncovertebral spurring at C4-5
and to a slightly lesser extent at C7-T1.
